# Patient Record
Sex: Male | Born: 1985 | Race: Black or African American | Hispanic: No | Marital: Single | Smoking: Never smoker
Health system: Southern US, Community
[De-identification: ages and names within clinical notes are randomized; demographics above are authoritative.]

---
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
## 2009-10-28 ENCOUNTER — Emergency Department (HOSPITAL_COMMUNITY): Admission: EM | Admit: 2009-10-28 | Discharge: 2009-10-28 | Payer: Self-pay | Admitting: Emergency Medicine

## 2009-10-29 ENCOUNTER — Emergency Department (HOSPITAL_COMMUNITY): Admission: EM | Admit: 2009-10-29 | Discharge: 2009-10-29 | Payer: Self-pay | Admitting: Emergency Medicine

## 2010-03-16 LAB — URINALYSIS, ROUTINE W REFLEX MICROSCOPIC
Bilirubin Urine: NEGATIVE
Glucose, UA: NEGATIVE mg/dL
Hgb urine dipstick: NEGATIVE
Specific Gravity, Urine: 1.031 — ABNORMAL HIGH (ref 1.005–1.030)
Urobilinogen, UA: 1 mg/dL (ref 0.0–1.0)

## 2011-06-17 ENCOUNTER — Emergency Department (HOSPITAL_COMMUNITY)
Admission: EM | Admit: 2011-06-17 | Discharge: 2011-06-18 | Disposition: A | Discharge status: Home / Self Care | Payer: Self-pay | Attending: Emergency Medicine | Admitting: Emergency Medicine

## 2011-06-17 ENCOUNTER — Encounter (HOSPITAL_COMMUNITY): Payer: Self-pay | Admitting: Emergency Medicine

## 2011-06-17 DIAGNOSIS — M79609 Pain in unspecified limb: Secondary | ICD-10-CM | POA: Insufficient documentation

## 2011-06-17 DIAGNOSIS — M25549 Pain in joints of unspecified hand: Secondary | ICD-10-CM | POA: Insufficient documentation

## 2011-06-17 DIAGNOSIS — IMO0001 Reserved for inherently not codable concepts without codable children: Secondary | ICD-10-CM | POA: Insufficient documentation

## 2011-06-17 DIAGNOSIS — M25519 Pain in unspecified shoulder: Secondary | ICD-10-CM | POA: Insufficient documentation

## 2011-06-17 DIAGNOSIS — M25531 Pain in right wrist: Secondary | ICD-10-CM

## 2011-06-17 NOTE — ED Notes (Signed)
Pt c/o bilateral joint pain in hands x1 week. Pt states joints in hands become very painful and stiff. Pain radiates up to shoulder.

## 2011-06-17 NOTE — ED Notes (Signed)
Pt alert, nad, c/o pain in hands, arms, shoulders, onset last weekend, denies trauma or injury, resp even unlabored, skin pwd

## 2011-06-18 MED ORDER — IBUPROFEN 800 MG PO TABS: 800.0000 mg | ORAL_TABLET | Freq: Three times a day (TID) | ORAL | Status: AC

## 2011-06-18 MED ORDER — OXYCODONE-ACETAMINOPHEN 5-325 MG PO TABS: 2.0000 | ORAL_TABLET | ORAL | Status: AC | PRN

## 2011-06-18 MED ORDER — IBUPROFEN 800 MG PO TABS
800.0000 mg | ORAL_TABLET | Freq: Once | ORAL | Status: AC
  Administered 2011-06-18: 800 mg via ORAL
  Filled 2011-06-18: qty 1

## 2011-06-18 NOTE — Discharge Instructions (Signed)
Mr Edgar Barr  The pain you're having in you hands wrists arms shoulders and chest could be due to the repetitive motion. We do not test for arthritis or do MRIs in the ER unless you're having a stroke. Get a primary care provider from the list below and you can have them checked test you for all of the things you were wondering to be tested for. For now take ibuprofen 800 mg every 6 hours x24 hours and then as needed. You can use ice on your hands and wrists and rest tomorrow. All provided a work note for you do not have to work tomorrow.   Arthralgia Arthralgia is joint pain. A joint is a place where two bones meet. Joint pain can happen for many reasons. The joint can be bruised, stiff, infected, or weak from aging. Pain usually goes away after resting and taking medicine for soreness.  HOME CARE  Rest the joint as told by your doctor.   Keep the sore joint raised (elevated) for the first 24 hours.   Put ice on the joint area.   Put ice in a plastic bag.   Place a towel between your skin and the bag.   Leave the ice on for 15 to 20 minutes, 3 to 4 times a day.   Wear your splint, casting, elastic bandage, or sling as told by your doctor.   Only take medicine as told by your doctor. Do not take aspirin.   Use crutches as told by your doctor. Do not put weight on the joint until told to by your doctor.  GET HELP RIGHT AWAY IF:   You have bruising, puffiness (swelling), or more pain.   Your fingers or toes turn blue or start to lose feeling (numb).   Your medicine does not lessen the pain.   Your pain becomes severe.   You have a temperature by mouth above 102 F (38.9 C), not controlled by medicine.   You cannot move or use the joint.  MAKE SURE YOU:   Understand these instructions.   Will watch your condition.   Will get help right away if you are not doing well or get worse.  Document Released: 12/07/2008 Document Revised: 12/08/2010 Document Reviewed: 12/07/2008 [facility]  Patient Information 2012 [place], [place].Cryotherapy Cryotherapy means treatment with cold. Ice or gel packs can be used to reduce both pain and swelling. Ice is the most helpful within the first 24 to 48 hours after an injury or flareup from overusing a muscle or joint. Sprains, strains, spasms, burning pain, shooting pain, and aches can all be eased with ice. Ice can also be used when recovering from surgery. Ice is effective, has very few side effects, and is safe for most people to use. PRECAUTIONS  Ice is not a safe treatment option for people with:  Raynaud's phenomenon. This is a condition affecting small blood vessels in the extremities. Exposure to cold may cause your problems to return.   Cold hypersensitivity. There are many forms of cold hypersensitivity, including:   Cold urticaria. Red, itchy hives appear on the skin when the tissues begin to warm after being iced.   Cold erythema. This is a red, itchy rash caused by exposure to cold.   Cold hemoglobinuria. Red blood cells break down when the tissues begin to warm after being iced. The hemoglobin that carry oxygen are passed into the urine because they cannot combine with blood proteins fast enough.   Numbness or altered sensitivity in the  area being iced.  If you have any of the following conditions, do not use ice until you have discussed cryotherapy with your caregiver:  Heart conditions, such as arrhythmia, angina, or chronic heart disease.   High blood pressure.   Healing wounds or open skin in the area being iced.   Current infections.   Rheumatoid arthritis.   Poor circulation.   Diabetes.  Ice slows the blood flow in the region it is applied. This is beneficial when trying to stop inflamed tissues from spreading irritating chemicals to surrounding tissues. However, if you expose your skin to cold temperatures for too long or without the proper protection, you can damage your skin or nerves. Watch for signs of skin  damage due to cold. HOME CARE INSTRUCTIONS Follow these tips to use ice and cold packs safely.  Place a dry or damp towel between the ice and skin. A damp towel will cool the skin more quickly, so you may need to shorten the time that the ice is used.   For a more rapid response, add gentle compression to the ice.   Ice for no more than 10 to 20 minutes at a time. The bonier the area you are icing, the less time it will take to get the benefits of ice.   Check your skin after 5 minutes to make sure there are no signs of a poor response to cold or skin damage.   Rest 20 minutes or more in between uses.   Once your skin is numb, you can end your treatment. You can test numbness by very lightly touching your skin. The touch should be so light that you do not see the skin dimple from the pressure of your fingertip. When using ice, most people will feel these normal sensations in this order: cold, burning, aching, and numbness.   Do not use ice on someone who cannot communicate their responses to pain, such as small children or people with dementia.  HOW TO MAKE AN ICE PACK Ice packs are the most common way to use ice therapy. Other methods include ice massage, ice baths, and cryo-sprays. Muscle creams that cause a cold, tingly feeling do not offer the same benefits that ice offers and should not be used as a substitute unless recommended by your caregiver. To make an ice pack, do one of the following:  Place crushed ice or a bag of frozen vegetables in a sealable plastic bag. Squeeze out the excess air. Place this bag inside another plastic bag. Slide the bag into a pillowcase or place a damp towel between your skin and the bag.   Mix 3 parts water with 1 part rubbing alcohol. Freeze the mixture in a sealable plastic bag. When you remove the mixture from the freezer, it will be slushy. Squeeze out the excess air. Place this bag inside another plastic bag. Slide the bag into a pillowcase or place a  damp towel between your skin and the bag.  SEEK MEDICAL CARE IF:  You develop white spots on your skin. This may give the skin a blotchy (mottled) appearance.   Your skin turns blue or pale.   Your skin becomes waxy or hard.   Your swelling gets worse.  MAKE SURE YOU:   Understand these instructions.   Will watch your condition.   Will get help right away if you are not doing well or get worse.  Document Released: 08/15/2010 Document Revised: 12/08/2010 Document Reviewed: 08/15/2010 [employer] Patient Information 2012  [employer].

## 2011-06-18 NOTE — ED Provider Notes (Signed)
History     CSN: [id number]  Arrival date & time 06/17/11  2115   First MD Initiated Contact with Patient 06/17/11 2354      Chief Complaint  Patient presents with  . Joint Pain    (Consider location/radiation/quality/duration/timing/severity/associated sxs/prior treatment) Patient is a 26 y.o. male presenting with hand pain.  Hand Pain This is a new problem. The current episode started in the past 7 days. The problem occurs daily. The problem has been gradually worsening. Associated symptoms include arthralgias and myalgias. Pertinent negatives include no chest pain, chills, diaphoresis, fever, joint swelling, nausea, numbness, vomiting or weakness. Exacerbated by: working. Treatments tried: advil x 2. The treatment provided mild relief.  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  States for the past few days his hand and arms are very arthritic.  Pain is worse in the am.  He has taken advil x 2 this week with no relief.  No weakness, hot joints or swelling noted.  Insists that we need to do blood tests to check his veins.  States the pain is in his veins even though he describes arthritic pain. Denies fever, nausea, or vomiting. Pain does not wake him up at night.  Needs work note for today and tomorrow.  History reviewed. No pertinent past medical history.  History reviewed. No pertinent past surgical history.  No family history on file.  History  Substance Use Topics  . Smoking status: Never Smoker   . Smokeless tobacco: Not on file  . Alcohol Use: No      Review of Systems  Constitutional: Negative.  Negative for fever, chills and diaphoresis.  HENT: Negative.   Eyes: Negative.   Respiratory: Negative.   Cardiovascular: Negative.  Negative for chest pain.  Gastrointestinal: Negative.  Negative for nausea and vomiting.  Musculoskeletal: Positive for myalgias and arthralgias. Negative for joint swelling.  Neurological:  Negative.  Negative for weakness and numbness.  Psychiatric/Behavioral: Negative.   All other systems reviewed and are negative.    Allergies  Review of patient's allergies indicates no known allergies.  Home Medications   Current Outpatient Rx  Name Route Sig Dispense Refill  . IBUPROFEN 200 MG PO TABS Oral Take 800 mg by mouth every 6 (six) hours as needed. pain      BP 147/76  Pulse 94  Temp 98 F (36.7 C)  Resp 16  Wt 295 lb (133.811 kg)  SpO2 99%  Physical Exam  Nursing note and vitals reviewed. Constitutional: He is oriented to person, place, and time. He appears well-developed and well-nourished.  HENT:  Head: Normocephalic.  Eyes: Conjunctivae and EOM are normal. Pupils are equal, round, and reactive to light.  Neck: Normal range of motion. Neck supple.  Cardiovascular: Normal rate.   Pulmonary/Chest: Effort normal.  Abdominal: Soft.  Musculoskeletal: Normal range of motion. He exhibits no edema and no tenderness.       No tenderness upon palpation Good sensation and movement to bilateral upper extrem  Neurological: He is alert and oriented to person, place, and time.  Skin: Skin is warm and dry.  Psychiatric: He has a normal mood and affect.    ED Course  Procedures (including critical care time)  Labs Reviewed - No data to display No results found.   No diagnosis found.    MDM  Bilateral upper extremity arthragas with no weakness or paresis.  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  Worse in the am.  Ibuprofen 800mg  every 6 hours x 24.  Intermittant ice.  Follow up with PCP if not better.  Doubt carpel tunnel.  Wants work note.         Remi Haggard, NP 06/18/11 [phone]

## 2011-06-25 NOTE — ED Provider Notes (Signed)
Medical screening examination/treatment/procedure(s) were performed by non-physician practitioner and as supervising physician I was immediately available for consultation/collaboration.  Everlina Gotts, MD 06/25/11 2336 

## 2012-02-25 IMAGING — US US MISC SOFT TISSUE
1 series · 13 of 13 positions shown · non-contrast
Comparison: None.

CLINICAL DATA: Penile fracture.  Penile pain.

ULTRASOUND OF HEAD/NECK SOFT TISSUES
TECHNIQUE: Ultrasound examination of the head and neck soft
tissues was performed in the area of clinical concern.

[Series 1: us misc soft tissue · 0.08mm/px · 13 acquisitions, 13 frames shown]
[im 1/13]
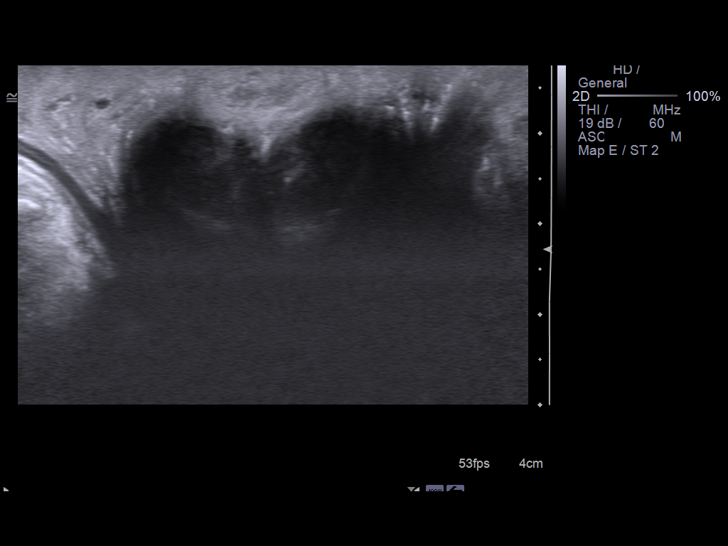
[im 2/13]
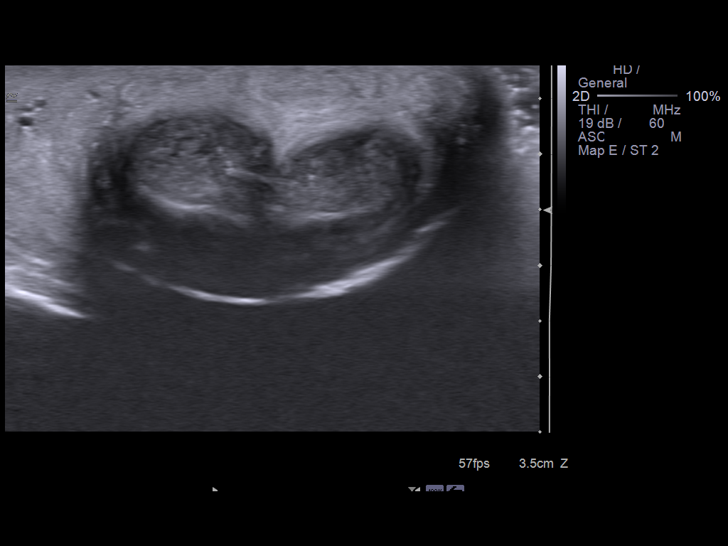
[im 3/13]
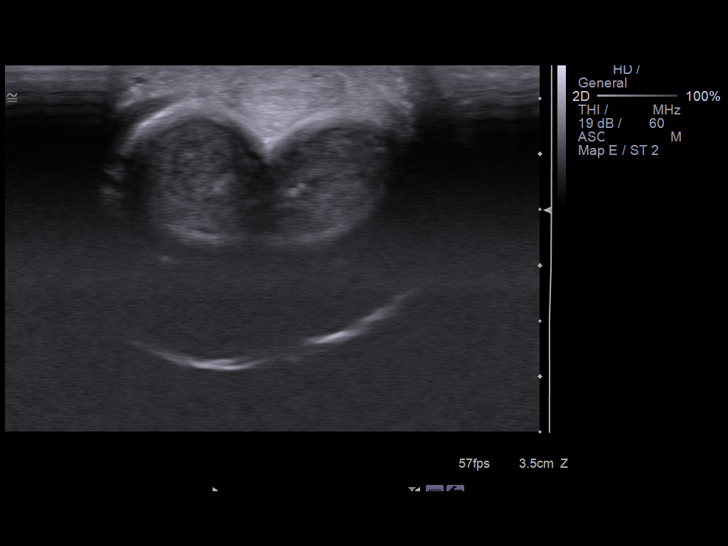
[im 4/13]
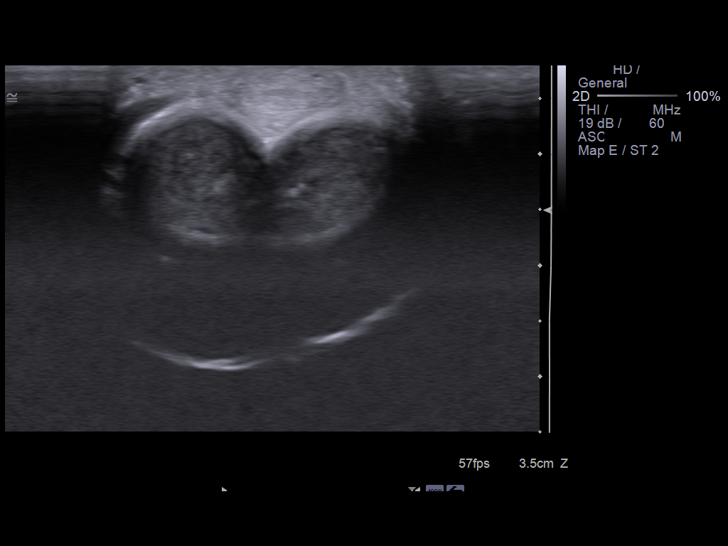
[im 5/13]
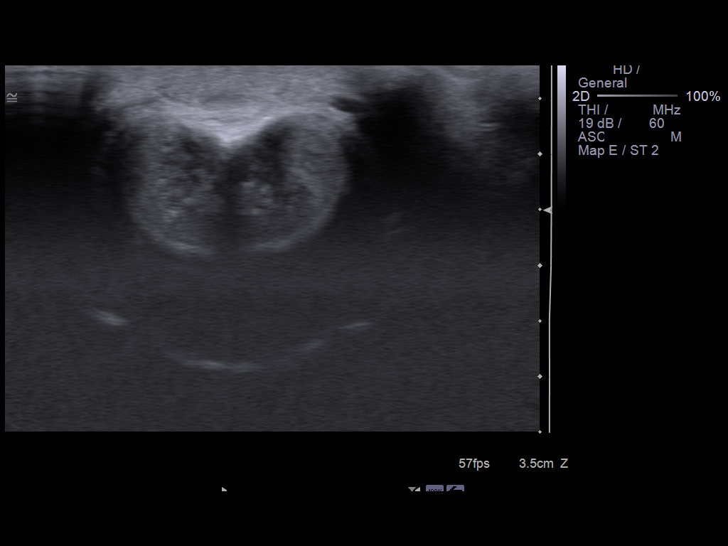
[im 6/13]
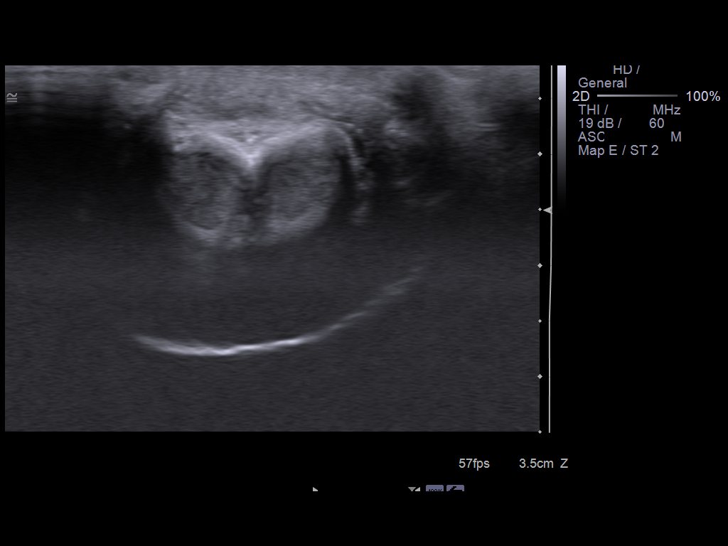
[im 7/13]
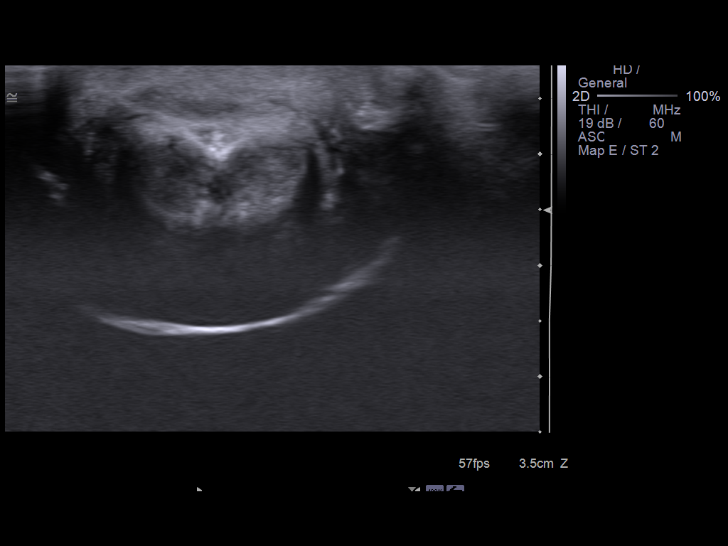
[im 8/13]
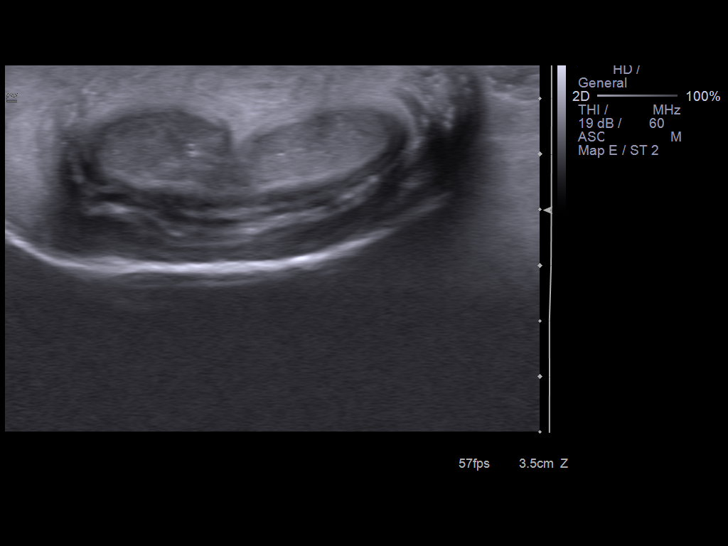
[im 9/13]
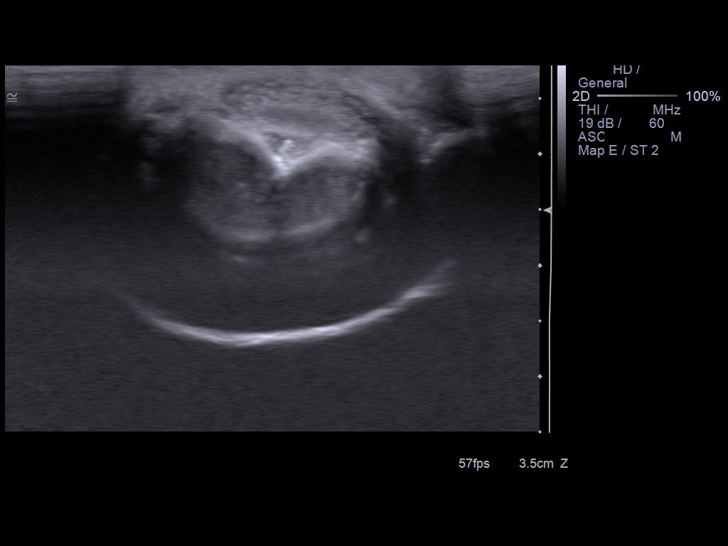
[im 10/13]
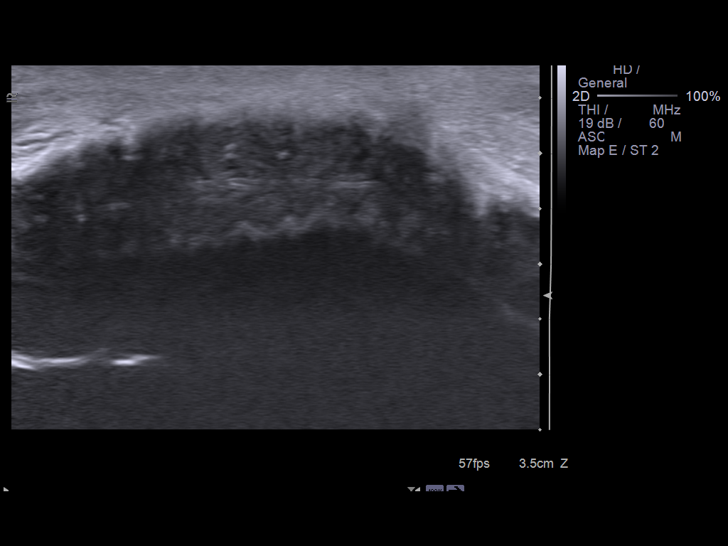
[im 11/13]
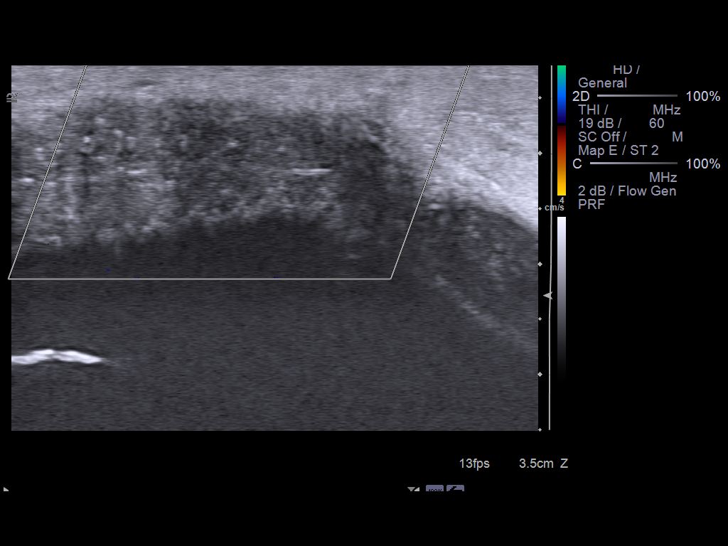
[im 12/13]
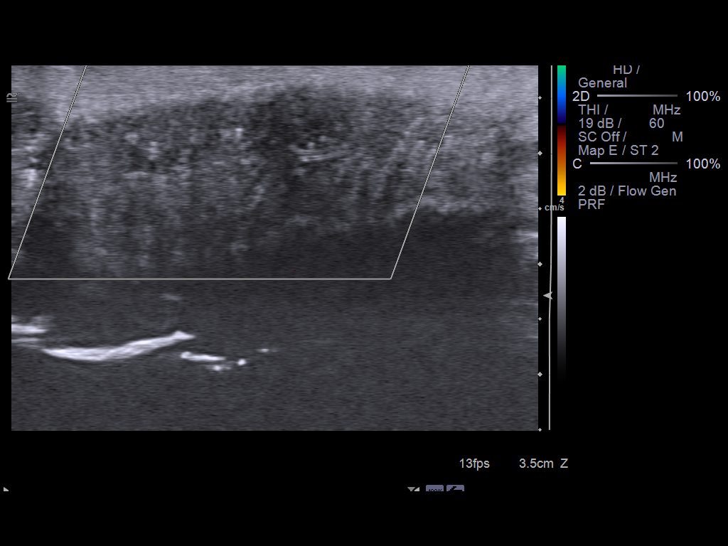
[im 13/13]
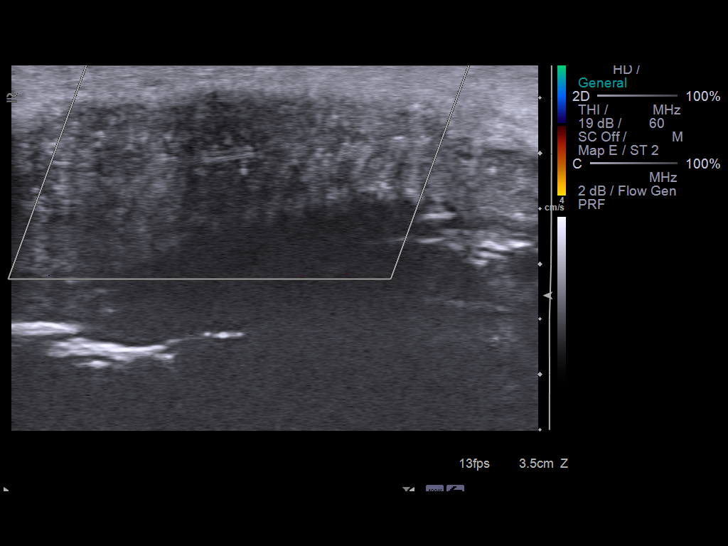

[13 of 13 positions shown; findings below may reference images not displayed]

FINDINGS: Dedicated ultrasound of the penis was performed.  There
is no evidence L fracture.  Corpora cavernosum appears intact.  No
hematoma is identified.
IMPRESSION: No evidence of penile fracture.

## 2018-09-03 HISTORY — PX: TOOTH EXTRACTION: SUR596

## 2018-10-04 ENCOUNTER — Ambulatory Visit: Payer: Self-pay

## 2018-11-27 ENCOUNTER — Ambulatory Visit: Payer: Self-pay | Admitting: Family Medicine

## 2018-11-27 ENCOUNTER — Ambulatory Visit: Payer: Self-pay

## 2018-11-27 ENCOUNTER — Encounter: Payer: Self-pay | Admitting: Family Medicine

## 2018-11-27 ENCOUNTER — Other Ambulatory Visit: Payer: Self-pay

## 2018-11-27 DIAGNOSIS — F419 Anxiety disorder, unspecified: Secondary | ICD-10-CM

## 2018-11-27 DIAGNOSIS — F329 Major depressive disorder, single episode, unspecified: Secondary | ICD-10-CM

## 2018-11-27 DIAGNOSIS — Z113 Encounter for screening for infections with a predominantly sexual mode of transmission: Secondary | ICD-10-CM

## 2018-11-27 LAB — GRAM STAIN

## 2018-11-27 NOTE — Progress Notes (Signed)
Gram stain reviewed. No treatment per SO Aileen Fass, RN

## 2018-11-27 NOTE — Progress Notes (Signed)
STI clinic/screening visit  Subjective:  Nelvin Tomb is a 33 y.o. male being seen today for an STI screening visit. The patient reports they do not have symptoms.    Patient has the following medical conditions:  There are no active problems to display for this patient.    Chief Complaint  Patient presents with  . SEXUALLY TRANSMITTED DISEASE    HPI  Patient reports he would like STI testing. Denies symptoms.   Upon flowsheet questioning pt endorses anxiety and depression. Was seeing a therapist but stopped d/t losing insurance.   See flowsheet for further details and programmatic requirements.    The following portions of the patient's history were reviewed and updated as appropriate: allergies, current medications, past medical history, past social history, past surgical history and problem list.  Objective:  There were no vitals filed for this visit.  Physical Exam Constitutional:      Appearance: Normal appearance.  HENT:     Head: Normocephalic and atraumatic.     Comments: No nits or hair loss    Mouth/Throat:     Mouth: Mucous membranes are moist.     Pharynx: Oropharynx is clear. No oropharyngeal exudate or posterior oropharyngeal erythema.  Pulmonary:     Effort: Pulmonary effort is normal.  Abdominal:     General: Abdomen is flat.     Palpations: Abdomen is soft. There is no hepatomegaly or mass.     Tenderness: There is no abdominal tenderness.  Genitourinary:    Pubic Area: No rash or pubic lice.      Penis: Normal and circumcised. No discharge.      Scrotum/Testes: Normal.     Epididymis:     Right: Normal.     Left: Normal.     Rectum: Normal.  Lymphadenopathy:     Head:     Right side of head: No preauricular or posterior auricular adenopathy.     Left side of head: No preauricular or posterior auricular adenopathy.     Cervical: No cervical adenopathy.     Upper Body:     Right upper body: No supraclavicular or axillary adenopathy.     Left  upper body: No supraclavicular or axillary adenopathy.     Lower Body: No right inguinal adenopathy. No left inguinal adenopathy.  Skin:    General: Skin is warm and dry.     Findings: No rash.  Neurological:     Mental Status: He is alert and oriented to person, place, and time.       Assessment and Plan:  Fillmore Bynum is a 33 y.o. male presenting to the [facility] Department for STI screening  1. Screening examination for venereal disease -Screenings today as below.  -Patient does meet criteria for HepB HepC Screening. Accepts these screenings. -Discussed that GC/CT NAAT may take >1 week to result. Counseled on warning s/sx and when to seek care. Recommended condom use with all sex. - Gram stain - HIV/HCV [facility] - HBV Antigen/[facility]ibody [facility] - Syphilis Serology, [facility] - Gonococcus culture  2. Anxiety and depression Refer to Milton Ferguson, LCSW per pt request - Ambulatory referral to [facility]    Return for if needed.  No future appointments.  Kandee Keen, PA-C

## 2018-12-02 LAB — GONOCOCCUS CULTURE

## 2018-12-05 ENCOUNTER — Telehealth: Payer: Self-pay | Admitting: Licensed Clinical Social Worker

## 2018-12-06 LAB — HEPATITIS B SURFACE ANTIGEN

## 2018-12-06 LAB — HM HIV SCREENING LAB: HM HIV Screening: NEGATIVE

## 2018-12-06 LAB — HM HEPATITIS C SCREENING LAB: HM Hepatitis Screen: NEGATIVE

## 2018-12-16 NOTE — Telephone Encounter (Signed)
Attempted to call patient to follow up regarding sending by mail consent for treatment. LCSW left vm.

## 2019-12-19 ENCOUNTER — Ambulatory Visit: Payer: Self-pay | Admitting: Physician Assistant

## 2019-12-19 ENCOUNTER — Other Ambulatory Visit: Payer: Self-pay

## 2019-12-19 DIAGNOSIS — Z113 Encounter for screening for infections with a predominantly sexual mode of transmission: Secondary | ICD-10-CM

## 2019-12-19 DIAGNOSIS — Z202 Contact with and (suspected) exposure to infections with a predominantly sexual mode of transmission: Secondary | ICD-10-CM

## 2019-12-19 LAB — GRAM STAIN

## 2019-12-19 MED ORDER — METRONIDAZOLE 500 MG PO TABS: 500.0000 mg | ORAL_TABLET | Freq: Two times a day (BID) | ORAL | 0 refills | Status: DC

## 2019-12-20 ENCOUNTER — Encounter: Payer: Self-pay | Admitting: Physician Assistant

## 2019-12-20 NOTE — Progress Notes (Signed)
[facility] Department STI clinic/screening visit  Subjective:  Edgar Barr is a 34 y.o. male being seen today for an STI screening visit. The patient reports they do have symptoms.    Patient has the following medical conditions:  There are no problems to display for this patient.    Chief Complaint  Patient presents with  . SEXUALLY TRANSMITTED DISEASE    screening    HPI  Patient reports that about 1.5 weeks ago he started having a "pressure" feeling in his penis that spread to his kidneys and then starting yesterday having dysuria and had blood after urination this am.  States that he had been drinking more water and cranberry juice when his symptoms started and the pressure feeling had mostly resolved.  State last HIV test was 6-8 months ago and last void prior to sample collection for Gram stain was about 2 hr ago.  Per patient report, his weight is 340 lbs.   See flowsheet for further details and programmatic requirements.    The following portions of the patient's history were reviewed and updated as appropriate: allergies, current medications, past medical history, past social history, past surgical history and problem list.  Objective:  There were no vitals filed for this visit.  Physical Exam Constitutional:      General: He is not in acute distress.    Appearance: Normal appearance.  HENT:     Head: Normocephalic and atraumatic.     Comments: No nits,lice, or hair loss. No cervical, supraclavicular or axillary adenopathy.    Mouth/Throat:     Mouth: Mucous membranes are moist.     Pharynx: Oropharynx is clear. No oropharyngeal exudate or posterior oropharyngeal erythema.  Eyes:     Conjunctiva/sclera: Conjunctivae normal.  Pulmonary:     Effort: Pulmonary effort is normal.  Abdominal:     Palpations: Abdomen is soft. There is no mass.     Tenderness: There is no abdominal tenderness. There is no guarding or rebound.  Genitourinary:    Penis:  Normal.      Testes: Normal.     Comments: Pubic area without nits, lice, hair loss, edema, erythema, lesions and inguinal adenopathy. Penis circumcised without rash, lesions and discharge at meatus. Musculoskeletal:     Cervical back: Neck supple. No tenderness.  Skin:    General: Skin is warm and dry.     Findings: No bruising, erythema, lesion or rash.  Neurological:     Mental Status: He is alert and oriented to person, place, and time.  Psychiatric:        Mood and Affect: Mood normal.        Behavior: Behavior normal.        Thought Content: Thought content normal.        Judgment: Judgment normal.       Assessment and Plan:  Edgar Barr is a 34 y.o. male presenting to the [facility] Department for STI screening  1. Screening for STD (sexually transmitted disease) Patient into clinic with symptoms. Reviewed with patient that Gram stain is normal today and that it is not a test used to see Trich. Patient requests a copy of Gram stain result and signs ROI for copy.  ROI sent for scanning and copy of Gram stain made, reviewed with and given to patient. Rec condoms with all sex. Await test results.  Counseled that RN will call if needs to RTC for treatment once results are back. - Gram stain -  Gonococcus culture - HIV [facility] - Syphilis Serology, [facility]  2. Trichomonas contact Will treat as a contact to Trich with Metronidazole 500 mg #14 1 po BID for 7 days with food, no EtOH for 24 hr before and until 72 hr after completing medicine. No sex for 14 days and until after partner completes treatment. Call with questions or concerns. - metroNIDAZOLE (FLAGYL) 500 MG tablet; Take 1 tablet (500 mg total) by mouth 2 (two) times daily.  Dispense: 14 tablet; Refill: 0     No follow-ups on file.  No future appointments.  Matt Holmes, PA

## 2019-12-23 LAB — GONOCOCCUS CULTURE

## 2020-01-04 NOTE — Progress Notes (Signed)
Chart reviewed by Pharmacist  Suzanne Walker PharmD, Contract Pharmacist at [facility]  

## 2020-01-12 ENCOUNTER — Telehealth: Payer: Self-pay | Admitting: Family Medicine

## 2020-01-12 ENCOUNTER — Other Ambulatory Visit: Payer: Self-pay

## 2020-01-12 ENCOUNTER — Ambulatory Visit: Payer: Self-pay | Admitting: Physician Assistant

## 2020-01-12 DIAGNOSIS — Z299 Encounter for prophylactic measures, unspecified: Secondary | ICD-10-CM

## 2020-01-12 DIAGNOSIS — Z113 Encounter for screening for infections with a predominantly sexual mode of transmission: Secondary | ICD-10-CM

## 2020-01-12 MED ORDER — METRONIDAZOLE 500 MG PO TABS: 500.0000 mg | ORAL_TABLET | Freq: Two times a day (BID) | ORAL | 0 refills | Status: AC

## 2020-01-12 NOTE — Telephone Encounter (Signed)
Returned call to pt at phone number provided. Pt reports he was here on 12/19/2019 and received medication to be treated as a contact to Trich and reports he is having symptoms again. Pt reports he completed all medication but reports that he did have sex with partner that was also being treated while they were on the medication and then a couple of days after they completed their medication. Counseled pt that while taking medication for treatment that they should not have any sex at all even with a condom while on medication and then for a full 7 days after completion of medication. Pt states understanding. Counseled pt that since he does have symptoms he may need to speak with a provider to see if anything else may be going on but that he does need re-treatment. Pt scheduled for IS appt for today 01/12/2020 per pt request. Overbook appt make per Drema Pry, RN supervisor ok and pt aware that there may be a bit of a wait since it is an overbook appt and is ok with that. Pt aware of appt date an time and to arrive early for check-in. Pt states that his partner is aware of need to be re-treated and states that partner is going to call to schedule appt as well. Pt aware to not have any sex from now until 7 days after he completes all medicine for re-treatment and pt states understanding of counseling. Pt with no further questions or concerns at this time.

## 2020-01-12 NOTE — Telephone Encounter (Signed)
Pt states he came for at sti ck and was treated on Dec. 17th. He states his girlfriend was tested and treated one week later by her own daughter. He made appointment for both  to come in Jan 14th to get sti ck but wants to know if  Is possible to get a refill on the prescriptions instead.

## 2020-01-12 NOTE — Telephone Encounter (Signed)
Pt states he came in on Dec. 17th for sti ck and treatment, but symptoms have returned. He states that his girlfriend was also tested and treated by her doctor a week later. Both have symptoms again. Pt wants to know if he can get presecriptions for both or if they need to be retested.

## 2020-01-13 NOTE — Progress Notes (Signed)
S:  Patient into clinic requesting re-treatment for Trich.  Patient reports that he and his partner had sex during treatment and since treatment was completed and they both have symptoms.  Patient reports that he still has an itching sensation inside his penis and a small amount of white discharge.  NKDA.  Patient history reviewed from 12/19/2019, visit and denies changes except that he has had sex since then as above.  Patient declines repeat screening and requests re-treatment only. O:  WDWN male in NAD, A&O x 3, normal work of breathing. A/P:  1.  Patient with symptoms and did not follow instructions to not have sex until 7 days after completing treatment as a contact to Trich. 2.  Needs re-treatment for Trich with Metronidazole 500 mg #14 1 po BID for 7 days with food, no EtOH for 24 hr before and until 72 hr after completing medicine. The patient was dispensed Metronidazole today. I provided counseling today regarding the medication. We discussed the medication, the side effects and when to call clinic. Patient given the opportunity to ask questions. Questions answered.  3.  No sex for 14 days and until after partner completes treatment. 4.  Rec condoms with all sex for STD protection. 5.  RTC prn.

## 2020-01-13 NOTE — Progress Notes (Signed)
Chart reviewed by Pharmacist  Suzanne Walker PharmD, Contract Pharmacist at [facility]  

## 2020-01-16 ENCOUNTER — Ambulatory Visit: Payer: Self-pay

## 2020-04-21 ENCOUNTER — Encounter: Payer: Self-pay | Admitting: Licensed Clinical Social Worker

## 2020-04-21 ENCOUNTER — Ambulatory Visit: Payer: Self-pay | Admitting: Licensed Clinical Social Worker

## 2020-04-21 DIAGNOSIS — F411 Generalized anxiety disorder: Secondary | ICD-10-CM

## 2020-04-21 DIAGNOSIS — F331 Major depressive disorder, recurrent, moderate: Secondary | ICD-10-CM
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
## 2020-05-06 ENCOUNTER — Ambulatory Visit: Payer: Self-pay | Admitting: Licensed Clinical Social Worker

## 2020-05-06 DIAGNOSIS — F411 Generalized anxiety disorder: Secondary | ICD-10-CM

## 2020-05-06 DIAGNOSIS — F331 Major depressive disorder, recurrent, moderate: Secondary | ICD-10-CM

## 2020-05-06 NOTE — Progress Notes (Signed)
Counselor/Therapist Progress Note  Patient ID: Edgar Barr, MRN: [id number],    Date: 05/06/2020  Time Spent: 55 minutes    Treatment Type: Psychotherapy  Reported Symptoms: Obsessive thinking, Anhedonia, Sleep disturbance, Appetite disturbance and depressed mood, irritability, anxiety, anxious thoughts  Mental Status Exam:  Appearance:   Casual and Well Groomed     Behavior:  Appropriate and Sharing  Motor:  Normal  Speech/Language:   Clear and Coherent and Normal Rate  Affect:  Appropriate, Congruent and Full Range  Mood:  depressed  Thought process:  goal directed  Thought content:    WNL  Sensory/Perceptual disturbances:    WNL  Orientation:  oriented to person, place, time/date, situation and day of week  Attention:  Good  Concentration:  Good  Memory:  WNL  Fund of knowledge:   Good  Insight:    Good  Judgment:   Good  Impulse Control:  Good   Risk Assessment: Danger to Self:  No Self-injurious Behavior: No Danger to Others: No Duty to Warn:no Physical Aggression / Violence:No  Access to Firearms a concern: No  Gang Involvement:No   Subjective: Patient was engaged and cooperative throughout the session using time effectively to discuss thoughts, feelings, and treatment plan. Patient voices continued motivation for treatment and understanding of depression and anxiety issues. Patient is likely to benefit from future treatment because he is motivated to decrease symptoms and improve functioning.    Interventions: Cognitive Behavioral Therapy  Checked in with patient and reviewed previous session, including assessment and goal of treatment. Reviewed CBTs. Explored patient's goal of treatment and worked collaboratively to develop CBT treatment plan. Engaged patient in identfying targets for practice over the next 2 weeks, including reduction in use of social media and creating a routine. LCSW also encouraged patient to follow up with medication management eval appt. At  [facility]. Provided support through active listening, validation of feelings, and highlighted patient's strengths.  Diagnosis:   ICD-10-CM   1. Major depressive disorder, recurrent episode, moderate (HCC)  F33.1   2. Generalized anxiety disorder  F41.1     Plan: Patient's goal of treatment is to know how to handle things in a health way; address gambling issues    Treatment Target: Understand the relationship between thoughts, emotions, and behaviors  - Psychoeducation on CBT model   - Teach the connection between thoughts, emotions, and behaviors  - Enhance emotional awareness and discrimination of emotions  Treatment Target: Increase realistic balanced thinking -to learn how to replace thinking with thoughts that are more accurate or helpful - Explore patient's thoughts, beliefs, automatic thoughts, assumptions  - Identify and replace unhelpful thinking patterns (upsetting ideas, self-talk and mental images) - Process distress and allow for emotional release  - Questioning and challenging thoughts - Cognitive reappraisal  - Restructuring, Socratic questioning  - Provided psychoeducation on core beliefs, explore, and assist patient in identifying core beliefs and modify underlying beliefs  Treatment Target: Reducing vulnerability to "emotional mind" - Values clarification   - Self-care - nutrition, sleep hygiene, exercise  - What helps "me"  - Problem solving Treatment Target: Increase coping skills to increase emotional regulation  - Introduce mindfulness - Attention training - use of mindfulness  - Mindfulness practices  - Deep breathing  - Grounding techniques as necessary  - STOP technique  Future Appointments  Date Time Provider Department Center  05/19/2020  3:00 PM Kathreen Cosier, LCSW [facility] None    Kathreen Cosier, LCSW

## 2020-05-19 ENCOUNTER — Ambulatory Visit: Payer: Self-pay | Admitting: Licensed Clinical Social Worker

## 2020-05-26 ENCOUNTER — Ambulatory Visit: Payer: Self-pay | Admitting: Licensed Clinical Social Worker

## 2020-06-02 ENCOUNTER — Ambulatory Visit: Payer: Self-pay | Admitting: Licensed Clinical Social Worker

## 2021-03-14 ENCOUNTER — Ambulatory Visit: Payer: Self-pay

## 2023-01-29 ENCOUNTER — Ambulatory Visit: Payer: Self-pay | Admitting: Family Medicine

## 2023-01-30 ENCOUNTER — Encounter: Payer: Self-pay | Admitting: Family Medicine

## 2023-01-30 ENCOUNTER — Ambulatory Visit (INDEPENDENT_AMBULATORY_CARE_PROVIDER_SITE_OTHER): Payer: No Typology Code available for payment source | Admitting: Family Medicine

## 2023-01-30 VITALS — BP 116/78 | HR 85 | Ht 70.0 in | Wt 323.2 lb

## 2023-01-30 DIAGNOSIS — F325 Major depressive disorder, single episode, in full remission: Secondary | ICD-10-CM

## 2023-01-30 DIAGNOSIS — Z23 Encounter for immunization: Secondary | ICD-10-CM

## 2023-01-30 DIAGNOSIS — Z0000 Encounter for general adult medical examination without abnormal findings: Secondary | ICD-10-CM | POA: Diagnosis not present

## 2023-01-30 DIAGNOSIS — R5382 Chronic fatigue, unspecified: Secondary | ICD-10-CM | POA: Diagnosis not present

## 2023-01-30 DIAGNOSIS — R7309 Other abnormal glucose: Secondary | ICD-10-CM

## 2023-01-30 DIAGNOSIS — Z136 Encounter for screening for cardiovascular disorders: Secondary | ICD-10-CM

## 2023-01-30 DIAGNOSIS — Z7689 Persons encountering health services in other specified circumstances: Secondary | ICD-10-CM

## 2023-01-30 DIAGNOSIS — Z1159 Encounter for screening for other viral diseases: Secondary | ICD-10-CM

## 2023-01-30 DIAGNOSIS — E559 Vitamin D deficiency, unspecified: Secondary | ICD-10-CM

## 2023-01-30 DIAGNOSIS — F329 Major depressive disorder, single episode, unspecified: Secondary | ICD-10-CM | POA: Insufficient documentation

## 2023-01-30 NOTE — Assessment & Plan Note (Signed)
The patient is a 38 year old male who presents to establish care and discuss health maintenance.  He has never had a primary care doctor before and is interested in routine health screenings and preventive care. 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  Physical Exam General: Well Developed, well nourished, and in no acute distress.  Cardiac: Regular rate and rhythm, no murmurs, rubs, or gallops. Respiratory: Clear to auscultation bilaterally. Not using accessory muscles, speaking in full sentences.   General Health Maintenance New patient establishing care. Discussed importance of regular health maintenance, screenings, and annual physicals. -Schedule physical exam in one month. -Order interim annual physical labs and plan to review lab results at next visit and discuss any necessary interventions.

## 2023-01-30 NOTE — Progress Notes (Signed)
Primary Care / Sports Medicine Office Visit  Patient Information:  Patient ID: Edgar Barr, male DOB: [birth year] Age: 38 y.o. MRN: [id number]   Edgar Barr is a pleasant 38 y.o. male presenting with the following:  Chief Complaint  Patient presents with   Establish Care    Patient presents today to establish care. Patient would like to get labs to see his overall health.     Vitals:   01/30/23 1409  BP: 116/78  Pulse: 85  SpO2: 98%   Vitals:   01/30/23 1409  Weight: (!) 323 lb 3.2 oz (146.6 kg)  Height: 5\' 10"  (1.778 m)   Body mass index is 46.37 kg/m.  No results found.   Independent interpretation of notes and tests performed by another provider:   None  Procedures performed:   None  Pertinent History, Exam, Impression, and Recommendations:   Problem List Items Addressed This Visit     Chronic fatigue - Primary   He is concerned about potentially low testosterone levels, which he believes may be affecting his mood, weight, focus, and energy levels. He wants to check his testosterone levels to ensure they are within a normal range.  Fatigue and Low Energy Patient reports persistent fatigue and low energy.patient suspects possible low testosterone, no prior testing. Discussed potential causes of fatigue including mood, sleep, sleep apnea, sugar issues, thyroid, anemia, and iron. -Order comprehensive blood work including total and free testosterone, thyroid function tests, complete blood count, and glucose. -Refer to urology if total testosterone is low for further evaluation and potential treatment. -Otherwise, will follow labs and medical history accordingly.      Relevant Orders   CBC   TSH   VITAMIN D 25 Hydroxy (Vit-D Deficiency, Fractures)   Testosterone,Free and Total   Encounter to establish care   The patient is a 38 year old male who presents to establish care and discuss health maintenance.  He has never had a primary care doctor before and is  interested in routine health screenings and preventive care. 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  Physical Exam General: Well Developed, well nourished, and in no acute distress.  Cardiac: Regular rate and rhythm, no murmurs, rubs, or gallops. Respiratory: Clear to auscultation bilaterally. Not using accessory muscles, speaking in full sentences.   General Health Maintenance New patient establishing care. Discussed importance of regular health maintenance, screenings, and annual physicals. -Schedule physical exam in one month. -Order interim annual physical labs and plan to review lab results at next visit and discuss any necessary interventions.      Major depression   Depression History of depression, currently managed without medication. Patient expressed interest in therapy. -Refer to behavioral therapy for further management.      Relevant Orders   Ambulatory referral to Psychology   Other Visit Diagnoses       Healthcare maintenance         Screening for cardiovascular condition       Relevant Orders   Lipid panel     Vitamin D deficiency       Relevant Orders   VITAMIN D 25 Hydroxy (Vit-D Deficiency, Fractures)     Screening for viral disease       Relevant Orders   Hepatitis C antibody   HIV Antibody (routine testing w rflx)     Abnormal glucose       Relevant Orders   Comprehensive metabolic panel   Hemoglobin A1c  Immunization due       Relevant Orders   Tdap vaccine greater than or equal to 7yo IM (Completed)        Orders & Medications Medications: No orders of the defined types were placed in this encounter.  Orders Placed This Encounter  Procedures   Tdap vaccine greater than or equal to 7yo IM   CBC   Comprehensive metabolic panel   Hemoglobin A1c   Hepatitis C antibody   HIV Antibody (routine testing w rflx)   Lipid panel   TSH   VITAMIN D 25 Hydroxy (Vit-D Deficiency, Fractures)   Testosterone,Free and  Total   Ambulatory referral to Psychology     Return in about 4 weeks (around 02/27/2023) for CPE.     Jerrol Banana, MD, [facility]   Primary Care Sports Medicine Primary Care and Sports Medicine at [facility]

## 2023-01-30 NOTE — Patient Instructions (Signed)
Patient Next Steps:  - Conduct blood work. - Referral to behavioral therapy placed. - Ensure all immunizations are up to date, tetanus shot administered today. - Schedule a physical exam in one month to review lab results and discuss interventions.

## 2023-01-30 NOTE — Assessment & Plan Note (Signed)
Depression History of depression, currently managed without medication. Patient expressed interest in therapy. -Refer to behavioral therapy for further management.

## 2023-01-30 NOTE — Assessment & Plan Note (Signed)
He is concerned about potentially low testosterone levels, which he believes may be affecting his mood, weight, focus, and energy levels. He wants to check his testosterone levels to ensure they are within a normal range.  Fatigue and Low Energy Patient reports persistent fatigue and low energy.patient suspects possible low testosterone, no prior testing. Discussed potential causes of fatigue including mood, sleep, sleep apnea, sugar issues, thyroid, anemia, and iron. -Order comprehensive blood work including total and free testosterone, thyroid function tests, complete blood count, and glucose. -Refer to urology if total testosterone is low for further evaluation and potential treatment. -Otherwise, will follow labs and medical history accordingly.

## 2023-02-01 LAB — COMPREHENSIVE METABOLIC PANEL
ALT: 20 [IU]/L (ref 0–44)
AST: 15 [IU]/L (ref 0–40)
Albumin: 4.3 g/dL (ref 4.1–5.1)
Alkaline Phosphatase: 68 [IU]/L (ref 44–121)
BUN/Creatinine Ratio: 14 (ref 9–20)
BUN: 14 mg/dL (ref 6–20)
Bilirubin Total: 0.2 mg/dL (ref 0.0–1.2)
CO2: 25 mmol/L (ref 20–29)
Calcium: 9.6 mg/dL (ref 8.7–10.2)
Chloride: 100 mmol/L (ref 96–106)
Creatinine, Ser: 1 mg/dL (ref 0.76–1.27)
Globulin, Total: 2.9 g/dL (ref 1.5–4.5)
Glucose: 251 mg/dL — ABNORMAL HIGH (ref 70–99)
Potassium: 4.5 mmol/L (ref 3.5–5.2)
Sodium: 138 mmol/L (ref 134–144)
Total Protein: 7.2 g/dL (ref 6.0–8.5)
eGFR: 101 mL/min/{1.73_m2} (ref >59)

## 2023-02-01 LAB — CBC
Hematocrit: 45.8 % (ref 37.5–51.0)
Hemoglobin: 15 g/dL (ref 13.0–17.7)
MCH: 27.1 pg (ref 26.6–33.0)
MCHC: 32.8 g/dL (ref 31.5–35.7)
MCV: 83 fL (ref 79–97)
Platelets: 300 10*3/uL (ref 150–450)
RBC: 5.53 x10E6/uL (ref 4.14–5.80)
RDW: 13.1 % (ref 11.6–15.4)
WBC: 6.7 10*3/uL (ref 3.4–10.8)

## 2023-02-01 LAB — LIPID PANEL
Chol/HDL Ratio: 5.5 {ratio} — ABNORMAL HIGH (ref 0.0–5.0)
Cholesterol, Total: 247 mg/dL — ABNORMAL HIGH (ref 100–199)
HDL: 45 mg/dL (ref >39)
LDL Chol Calc (NIH): 175 mg/dL — ABNORMAL HIGH (ref 0–99)
Triglycerides: 148 mg/dL (ref 0–149)
VLDL Cholesterol Cal: 27 mg/dL (ref 5–40)

## 2023-02-01 LAB — VITAMIN D 25 HYDROXY (VIT D DEFICIENCY, FRACTURES): Vit D, 25-Hydroxy: 16.4 ng/mL — ABNORMAL LOW (ref 30.0–100.0)

## 2023-02-01 LAB — HEPATITIS C ANTIBODY: Hep C Virus Ab: NONREACTIVE

## 2023-02-01 LAB — HEMOGLOBIN A1C
Est. average glucose Bld gHb Est-mCnc: 303 mg/dL
Hgb A1c MFr Bld: 12.2 % — ABNORMAL HIGH (ref 4.8–5.6)

## 2023-02-01 LAB — HIV ANTIBODY (ROUTINE TESTING W REFLEX): HIV Screen 4th Generation wRfx: NONREACTIVE

## 2023-02-01 LAB — TESTOSTERONE,FREE AND TOTAL
Testosterone, Free: 14 pg/mL (ref 8.7–25.1)
Testosterone: 331 ng/dL (ref 264–916)

## 2023-02-01 LAB — TSH: TSH: 1.69 u[IU]/mL (ref 0.450–4.500)

## 2023-02-16 ENCOUNTER — Encounter: Payer: Self-pay | Admitting: Family Medicine

## 2023-02-16 ENCOUNTER — Ambulatory Visit (INDEPENDENT_AMBULATORY_CARE_PROVIDER_SITE_OTHER): Payer: No Typology Code available for payment source | Admitting: Family Medicine

## 2023-02-16 VITALS — BP 124/84 | HR 90 | Ht 70.0 in | Wt 321.0 lb

## 2023-02-16 DIAGNOSIS — R5382 Chronic fatigue, unspecified: Secondary | ICD-10-CM

## 2023-02-16 DIAGNOSIS — E7849 Other hyperlipidemia: Secondary | ICD-10-CM | POA: Diagnosis not present

## 2023-02-16 DIAGNOSIS — E559 Vitamin D deficiency, unspecified: Secondary | ICD-10-CM

## 2023-02-16 DIAGNOSIS — Z7984 Long term (current) use of oral hypoglycemic drugs: Secondary | ICD-10-CM | POA: Diagnosis not present

## 2023-02-18 DIAGNOSIS — E559 Vitamin D deficiency, unspecified: Secondary | ICD-10-CM | POA: Insufficient documentation

## 2023-02-18 DIAGNOSIS — Z7984 Long term (current) use of oral hypoglycemic drugs: Secondary | ICD-10-CM | POA: Insufficient documentation

## 2023-02-18 DIAGNOSIS — E7849 Other hyperlipidemia: Secondary | ICD-10-CM | POA: Insufficient documentation

## 2023-02-18 MED ORDER — METFORMIN HCL ER 500 MG PO TB24: 500.0000 mg | ORAL_TABLET | Freq: Every day | ORAL | 2 refills | Status: DC

## 2023-02-18 MED ORDER — VITAMIN D (ERGOCALCIFEROL) 1.25 MG (50000 UNIT) PO CAPS: 50000.0000 [IU] | ORAL_CAPSULE | ORAL | 0 refills | Status: AC

## 2023-02-18 MED ORDER — ATORVASTATIN CALCIUM 10 MG PO TABS: 10.0000 mg | ORAL_TABLET | Freq: Every day | ORAL | 0 refills | Status: DC

## 2023-02-18 NOTE — Assessment & Plan Note (Signed)
 Hyperlipidemia Elevated total cholesterol and cholesterol ratio. Discussed the increased cardiovascular risk associated with diabetes and hyperlipidemia. -Start statin therapy for cholesterol control and cardiovascular protection. -He is considering dietary changes, including a shift towards a carnivore diet, however we discussed the concern about the impact on his cholesterol levels. -Plan to recheck cholesterol levels in 3 months.

## 2023-02-18 NOTE — Assessment & Plan Note (Signed)
 Given recent labs can be thought secondary to uncontrolled DM2, low vitamin D, cardiovascular components. See additional assessment(s) for plan details.

## 2023-02-18 NOTE — Progress Notes (Signed)
 Primary Care / Sports Medicine Office Visit  Patient Information:  Patient ID: Edgar Barr, male DOB: Feb 02, 1985 Age: 38 y.o. MRN: [id number]   Edgar Barr is a pleasant 38 y.o. male presenting with the following:  Chief Complaint  Patient presents with   Follow-up    Patient presents today for a follow up to discuss labs.     Vitals:   02/16/23 1602  BP: 124/84  Pulse: 90  SpO2: 96%   Vitals:   02/16/23 1602  Weight: (!) 321 lb (145.6 kg)  Height: 5\' 10"  (1.778 m)   Body mass index is 46.06 kg/m.  No results found.   Independent interpretation of notes and tests performed by another provider:   None  Procedures performed:   None  Pertinent History, Exam, Impression, and Recommendations:   Problem List Items Addressed This Visit     Chronic fatigue   Given recent labs can be thought secondary to uncontrolled DM2, low vitamin D, cardiovascular components. See additional assessment(s) for plan details.      Long term current use of oral hypoglycemic drug - Primary   He presents for follow-up to recent labs with new diagnosis of DM2, with a recent hemoglobin A1c at 12.2. He experiences fatigue, increased thirst, and frequent urination, particularly when consuming certain diets.  Type 2 Diabetes Mellitus Newly diagnosed with elevated A1c. Discussed the pathophysiology of insulin desensitivity and the potential complications of uncontrolled diabetes. We spent a length of time reviewing evidence based management of DM2. -Start Metformin 500mg , titrate up to a maximum of 2000mg  daily as tolerated. -Encouraged lifestyle modifications including diet changes and regular exercise. -Plan to recheck A1c in 3 months at return visit. -Nutritionist referral placed.      Relevant Medications   metFORMIN (GLUCOPHAGE-XR) 500 MG 24 hr tablet   Other Relevant Orders   Ambulatory referral to diabetic education   Other hyperlipidemia   Hyperlipidemia Elevated total  cholesterol and cholesterol ratio. Discussed the increased cardiovascular risk associated with diabetes and hyperlipidemia. -Start statin therapy for cholesterol control and cardiovascular protection. -He is considering dietary changes, including a shift towards a carnivore diet, however we discussed the concern about the impact on his cholesterol levels. -Plan to recheck cholesterol levels in 3 months.      Relevant Medications   atorvastatin (LIPITOR) 10 MG tablet   Other Relevant Orders   Ambulatory referral to diabetic education   Vitamin D deficiency   Relevant Medications   Vitamin D, Ergocalciferol, (DRISDOL) 1.25 MG (50000 UNIT) CAPS capsule   I provided a total time of 40 minutes including both face-to-face and non-face-to-face time on 02/18/2023 inclusive of time utilized for medical chart review, information gathering, care coordination with staff, and documentation completion.   Orders & Medications Medications:  Meds ordered this encounter  Medications   metFORMIN (GLUCOPHAGE-XR) 500 MG 24 hr tablet    Sig: Take 1 tablet (500 mg total) by mouth daily with breakfast. Increase by 1 tablet (500 mg) PO daily every 1-2 weeks. Do not exceed 4 tablets (2000 mg) PO daily.    Dispense:  90 tablet    Refill:  2   atorvastatin (LIPITOR) 10 MG tablet    Sig: Take 1 tablet (10 mg total) by mouth daily.    Dispense:  90 tablet    Refill:  0   Vitamin D, Ergocalciferol, (DRISDOL) 1.25 MG (50000 UNIT) CAPS capsule    Sig: Take 1 capsule (50,000 Units total) by mouth every  7 (seven) days for 8 doses. Take for 8 total doses(weeks)    Dispense:  8 capsule    Refill:  0   Orders Placed This Encounter  Procedures   Ambulatory referral to diabetic education     No follow-ups on file.     Jerrol Banana, MD, [facility]   Primary Care Sports Medicine Primary Care and Sports Medicine at [facility]

## 2023-02-18 NOTE — Patient Instructions (Addendum)
 Assessment and Plan  1. Chronic Fatigue    - Likely related to high blood sugar, low vitamin D, and heart health issues (high cholesterol).  2. Type 2 Diabetes    - Start taking Metformin 500 mg, gradually increasing to 2000 mg daily (can increase every 1-2 weeks) if tolerated.    - Make lifestyle changes, including healthier eating and regular exercise.    - Check blood sugar levels again in 3 months.    - Referral to a nutritionist has been made.  3. High Cholesterol    - Start taking atorvastatin 10 mg to help lower cholesterol.    - Discussed making dietary changes; will check cholesterol levels again in 3 months.  4. Low Vitamin D    - Continue taking Vitamin D supplements (Ergocalciferol 50,000 units).  Follow-up - Please come back in 3 months to check your blood sugar and cholesterol levels again. - Feel free to reach out to [place] if you have any questions or if your symptoms change.

## 2023-02-18 NOTE — Assessment & Plan Note (Signed)
 He presents for follow-up to recent labs with new diagnosis of DM2, with a recent hemoglobin A1c at 12.2. He experiences fatigue, increased thirst, and frequent urination, particularly when consuming certain diets.  Type 2 Diabetes Mellitus Newly diagnosed with elevated A1c. Discussed the pathophysiology of insulin desensitivity and the potential complications of uncontrolled diabetes. We spent a length of time reviewing evidence based management of DM2. -Start Metformin 500mg , titrate up to a maximum of 2000mg  daily as tolerated. -Encouraged lifestyle modifications including diet changes and regular exercise. -Plan to recheck A1c in 3 months at return visit. -Nutritionist referral placed.

## 2023-02-19 ENCOUNTER — Ambulatory Visit: Payer: Self-pay | Admitting: Family Medicine

## 2023-03-12 ENCOUNTER — Other Ambulatory Visit: Payer: Self-pay | Admitting: Family Medicine

## 2023-03-12 DIAGNOSIS — Z7984 Long term (current) use of oral hypoglycemic drugs: Secondary | ICD-10-CM

## 2023-03-13 NOTE — Telephone Encounter (Signed)
 Requested medications are due for refill today.  no  Requested medications are on the active medications list.  yes  Last refill. 02/18/2023 #90 2 rf  Future visit scheduled.   yes  Notes to clinic.    Pharmacy comment: REQUEST FOR 90 DAYS PRESCRIPTION. DX Code Needed.     Requested Prescriptions  Pending Prescriptions Disp Refills   metFORMIN (GLUCOPHAGE-XR) 500 MG 24 hr tablet [Pharmacy Med Name: METFORMIN HCL ER 500 MG TABLET] 270 tablet 1    Sig: TAKE 1 TABLET (500 MG TOTAL) BY MOUTH DAILY WITH BREAKFAST. INCREASE BY 1 TABLET (500 MG) DAILY EVERY 1-2 WEEKS. DO NOT EXCEED 4 TABLETS (2000 MG) DAILY     Endocrinology:  Diabetes - Biguanides Failed - 03/13/2023  2:29 PM      Failed - HBA1C is between 0 and 7.9 and within 180 days    Hgb A1c MFr Bld  Date Value Ref Range Status  01/30/2023 12.2 (H) 4.8 - 5.6 % Final    Comment:             Prediabetes: 5.7 - 6.4          Diabetes: >6.4          Glycemic control for adults with diabetes: <7.0          Failed - B12 Level in normal range and within 720 days    No results found for: "VITAMINB12"       Failed - CBC within normal limits and completed in the last 12 months    WBC  Date Value Ref Range Status  01/30/2023 6.7 3.4 - 10.8 x10E3/uL Final   RBC  Date Value Ref Range Status  01/30/2023 5.53 4.14 - 5.80 x10E6/uL Final   Hemoglobin  Date Value Ref Range Status  01/30/2023 15.0 13.0 - 17.7 g/dL Final   Hematocrit  Date Value Ref Range Status  01/30/2023 45.8 37.5 - 51.0 % Final   MCHC  Date Value Ref Range Status  01/30/2023 32.8 31.5 - 35.7 g/dL Final   [facility]  Date Value Ref Range Status  01/30/2023 27.1 26.6 - 33.0 pg Final   MCV  Date Value Ref Range Status  01/30/2023 83 79 - 97 fL Final   No results found for: "PLTCOUNTKUC", "LABPLAT", "POCPLA" RDW  Date Value Ref Range Status  01/30/2023 13.1 11.6 - 15.4 % Final         Passed - Cr in normal range and within 360 days    Creatinine, Ser  Date  Value Ref Range Status  01/30/2023 1.00 0.76 - 1.27 mg/dL Final         Passed - eGFR in normal range and within 360 days    eGFR  Date Value Ref Range Status  01/30/2023 101 >59 mL/min/1.73 Final         Passed - Valid encounter within last 6 months    Recent Outpatient Visits           1 month ago Chronic fatigue   [place] [facility] at [facility] Ashley Royalty, Ocie Bob, MD       Future Appointments             In 2 months Ashley Royalty, Ocie Bob, MD [facility] Primary Care & Sports Medicine at [facility]   In 3 months Ashley Royalty, Ocie Bob, MD [facility] Primary Care & Sports Medicine at [facility], [facility]

## 2023-04-16 ENCOUNTER — Encounter: Payer: No Typology Code available for payment source | Attending: Family Medicine | Admitting: Dietician

## 2023-04-16 ENCOUNTER — Encounter: Payer: Self-pay | Admitting: Dietician

## 2023-04-16 VITALS — Ht 70.0 in | Wt 327.8 lb

## 2023-04-16 DIAGNOSIS — Z6841 Body Mass Index (BMI) 40.0 and over, adult: Secondary | ICD-10-CM | POA: Diagnosis not present

## 2023-04-16 DIAGNOSIS — E7849 Other hyperlipidemia: Secondary | ICD-10-CM | POA: Diagnosis not present

## 2023-04-16 DIAGNOSIS — E119 Type 2 diabetes mellitus without complications: Secondary | ICD-10-CM | POA: Insufficient documentation

## 2023-04-16 DIAGNOSIS — Z7984 Long term (current) use of oral hypoglycemic drugs: Secondary | ICD-10-CM | POA: Diagnosis not present

## 2023-04-16 DIAGNOSIS — Z713 Dietary counseling and surveillance: Secondary | ICD-10-CM | POA: Insufficient documentation

## 2023-04-16 NOTE — Progress Notes (Signed)
 Diabetes Self-Management Education  Visit Type: First/Initial  Appt. Start Time: 1430 Appt. End Time: 1540  04/16/2023  [name], identified by name and date of birth, is a 38 y.o. male with a diagnosis of Diabetes: Type 2.   ASSESSMENT  Height 5\' 10"  (1.778 m), weight (!) 327 lb 12.8 oz (148.7 kg). Body mass index is 47.03 kg/m.   Diabetes Self-Management Education - 04/16/23 1444       Visit Information   Visit Type First/Initial      Initial Visit   Diabetes Type Type 2    Date Diagnosed 02/2023    Are you currently following a meal plan? No    Are you taking your medications as prescribed? Yes      Health Coping   How would you rate your overall health? Fair      Psychosocial Assessment   Patient Belief/Attitude about Diabetes Motivated to manage diabetes    What is the hardest part about your diabetes right now, causing you the most concern, or is the most worrisome to you about your diabetes?   Other (comment)   understanding carbs, definitions, label reading, needs   Self-care barriers None    Special Needs None    Learning Readiness Change in progress    How often do you need to have someone help you when you read instructions, pamphlets, or other written materials from your doctor or pharmacy? 1 - Never    What is the last grade level you completed in school? 24, 1 yr college      Pre-Education Assessment   Patient understands the diabetes disease and treatment process. Needs Review    Patient understands incorporating nutritional management into lifestyle. Needs Review    Patient undertands incorporating physical activity into lifestyle. Needs Review    Patient understands using medications safely. Comprehends key points    Patient understands monitoring blood glucose, interpreting and using results Comprehends key points    Patient understands prevention, detection, and treatment of acute complications. Needs Instruction    Patient understands  prevention, detection, and treatment of chronic complications. Needs Review    Patient understands how to develop strategies to address psychosocial issues. Needs Instruction    Patient understands how to develop strategies to promote health/change behavior. Comprehends key points      Complications   Last HgB A1C per patient/outside source 12.2 %    How often do you check your blood sugar? 1-2 times/day    Fasting Blood glucose range (mg/dL) 16-109   60-454, maybe 150 on weekends   Postprandial Blood glucose range (mg/dL) 09-811;914-782    Number of hypoglycemic episodes per month 0    Have you had a dilated eye exam in the past 12 months? No    Have you had a dental exam in the past 12 months? No    Are you checking your feet? Yes    How many days per week are you checking your feet? 3      Dietary Intake   Breakfast occasional toast/ french toast, strawberries and/or blueberries    Snack (morning) berries with whipped cream    Lunch 12-2pm same as supper    Dinner 4-7pm high protein ie chicken/ steakj/ egg/ pork cutlet + veg ie brocc/ cauliflower/ tomato/ spinach  avocado    Beverage(s) water, cinnamon water, low sugar/ sf juice, sugar free energy drink once every 1-2 weeks      Activity / Exercise   Activity / Exercise  Type Light (walking / raking leaves)    How many days per week do you exercise? 6    How many minutes per day do you exercise? 15    Total minutes per week of exercise 90      Patient Education   Previous Diabetes Education No   self led research   Disease Pathophysiology Definition of diabetes, type 1 and 2, and the diagnosis of diabetes;Explored patient's options for treatment of their diabetes    Healthy Eating Role of diet in the treatment of diabetes and the relationship between the three main macronutrients and blood glucose level;Food label reading, portion sizes and measuring food.;Plate Method;Meal timing in regards to the patients' current diabetes  medication.    Being Active Role of exercise on diabetes management, blood pressure control and cardiac health.    Monitoring Taught/discussed recording of test results and interpretation of SMBG.;Identified appropriate SMBG and/or A1C goals.;Purpose and frequency of SMBG.    Acute complications Taught prevention, symptoms, and  treatment of hypoglycemia - the 15 rule.;Discussed and identified patients' prevention, symptoms, and treatment of hyperglycemia.    Chronic complications Relationship between chronic complications and blood glucose control    Diabetes Stress and Support Role of stress on diabetes      Individualized Goals (developed by patient)   Nutrition General guidelines for healthy choices and portions discussed    Monitoring  Test my blood glucose as discussed      Outcomes   Expected Outcomes Demonstrated interest in learning. Expect positive outcomes    Future DMSE 4-6 wks             Individualized Plan for Diabetes Self-Management Training:   Learning Objective:  Patient will have a greater understanding of diabetes self-management. Patient education plan is to attend individual and/or group sessions per assessed needs and concerns.   Plan:   Patient Instructions  Suella Emmer job making diet and lifestyle changes, keep up the good work! Gradually work in [employer], allowing 45-60g with each meal. Eat a balanced meal or healthy snack every 3-5 hours during the day.  Continue to include some exercise/ physical activity most days of the week. Continue to check blood sugars 1-2 times a day, fasting and 1-2 hours after a meal most days.  Expected Outcomes:  Demonstrated interest in learning. Expect positive outcomes  Education material provided: How to Thrive ADA book; plate planner with food lists, general nutrition guidelines for Diabetes  If problems or questions, patient to contact team via:  Phone and patient portal  Future DSME appointment: 4-6 wks 05/30/23

## 2023-04-16 NOTE — Patient Instructions (Signed)
 Great job making diet and lifestyle changes, keep up the good work! Gradually work in [employer], allowing 45-60g with each meal. Eat a balanced meal or healthy snack every 3-5 hours during the day.  Continue to include some exercise/ physical activity most days of the week. Continue to check blood sugars 1-2 times a day, fasting and 1-2 hours after a meal most days.

## 2023-05-16 ENCOUNTER — Other Ambulatory Visit: Payer: Self-pay | Admitting: Family Medicine

## 2023-05-16 DIAGNOSIS — E7849 Other hyperlipidemia: Secondary | ICD-10-CM

## 2023-05-17 NOTE — Telephone Encounter (Signed)
 Requested Prescriptions  Pending Prescriptions Disp Refills   atorvastatin  (LIPITOR) 10 MG tablet [Pharmacy Med Name: ATORVASTATIN  10 MG TABLET] 90 tablet 0    Sig: TAKE 1 TABLET BY MOUTH EVERY DAY     Cardiovascular:  Antilipid - Statins Failed - 05/17/2023  1:09 PM      Failed - Lipid Panel in normal range within the last 12 months    Cholesterol, Total  Date Value Ref Range Status  01/30/2023 247 (H) 100 - 199 mg/dL Final   LDL Chol Calc (NIH)  Date Value Ref Range Status  01/30/2023 175 (H) 0 - 99 mg/dL Final   HDL  Date Value Ref Range Status  01/30/2023 45 >39 mg/dL Final   Triglycerides  Date Value Ref Range Status  01/30/2023 148 0 - 149 mg/dL Final         Passed - Patient is not pregnant      Passed - Valid encounter within last 12 months    Recent Outpatient Visits           3 months ago Long term current use of oral hypoglycemic drug   [place] [facility] at [facility], Dessie Flow, MD       Future Appointments             In 1 week Augustus Ledger, Dessie Flow, MD [facility] Primary Care & Sports Medicine at [facility], [facility]   In 1 week Kelvin Pattee, RD [facility] at [facility]   In 1 month Augustus Ledger, Dessie Flow, MD [facility] Primary Care & Sports Medicine at [facility], [facility]

## 2023-05-25 ENCOUNTER — Encounter: Payer: Self-pay | Admitting: *Deleted

## 2023-05-25 ENCOUNTER — Ambulatory Visit: Payer: No Typology Code available for payment source | Admitting: Family Medicine

## 2023-05-30 ENCOUNTER — Encounter: Attending: Family Medicine | Admitting: Dietician

## 2023-05-30 DIAGNOSIS — E119 Type 2 diabetes mellitus without complications: Secondary | ICD-10-CM | POA: Insufficient documentation
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
## 2023-06-19 ENCOUNTER — Encounter: Payer: Self-pay | Admitting: Family Medicine

## 2023-06-25 ENCOUNTER — Encounter: Payer: Self-pay | Admitting: Family Medicine

## 2023-07-02 ENCOUNTER — Encounter: Payer: Self-pay | Admitting: Family Medicine

## 2023-07-02 ENCOUNTER — Encounter: Admitting: Family Medicine

## 2023-07-12 ENCOUNTER — Telehealth: Admitting: Dietician

## 2023-07-23 ENCOUNTER — Telehealth: Payer: Self-pay | Admitting: Dietician

## 2023-07-23 NOTE — Telephone Encounter (Signed)
 Called patient to reschedule missed appointment from 07/12/23. He is unable to reschedule due to other issues he is currently dealing with; he states he will call back when able to resume diabetes visits.

## 2023-09-13 ENCOUNTER — Encounter: Payer: Self-pay | Admitting: Dietician

## 2023-09-13 NOTE — Progress Notes (Signed)
 Edgar Barr was not able to reschedule his missed appointment from 07/12/23. Sent notification to referring provider.
